# Patient Record
Sex: Female | Born: 1990 | Race: Black or African American | Hispanic: No | Marital: Single | Smoking: Current every day smoker
Health system: Southern US, Community
[De-identification: ages and names within clinical notes are randomized; demographics above are authoritative.]

## PROBLEM LIST (undated history)

## (undated) ENCOUNTER — Inpatient Hospital Stay (HOSPITAL_COMMUNITY): Payer: Self-pay

## (undated) DIAGNOSIS — F329 Major depressive disorder, single episode, unspecified: Secondary | ICD-10-CM

## (undated) DIAGNOSIS — I499 Cardiac arrhythmia, unspecified: Secondary | ICD-10-CM

## (undated) DIAGNOSIS — F32A Depression, unspecified: Secondary | ICD-10-CM

## (undated) DIAGNOSIS — F419 Anxiety disorder, unspecified: Secondary | ICD-10-CM

---
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
## 2009-12-20 ENCOUNTER — Inpatient Hospital Stay (HOSPITAL_COMMUNITY): Admission: AD | Admit: 2009-12-20 | Discharge: 2009-12-21 | Payer: Self-pay | Admitting: Obstetrics and Gynecology

## 2009-12-20 ENCOUNTER — Ambulatory Visit: Payer: Self-pay | Admitting: Obstetrics and Gynecology

## 2009-12-22 ENCOUNTER — Ambulatory Visit: Payer: Self-pay | Admitting: Obstetrics & Gynecology

## 2009-12-22 ENCOUNTER — Inpatient Hospital Stay (HOSPITAL_COMMUNITY): Admission: AD | Admit: 2009-12-22 | Discharge: 2009-12-25 | Payer: Self-pay | Admitting: Obstetrics and Gynecology

## 2010-02-08 ENCOUNTER — Encounter: Payer: Self-pay | Admitting: *Deleted

## 2010-05-26 NOTE — Miscellaneous (Signed)
Summary: Do Not Reschedule  Pt missed NP appt.  Per [facility] policy is not allowed to reschedule.  Dennison Nancy RN  February 08, 2010 2:31 PM

## 2010-07-09 LAB — CBC
Hemoglobin: 9.9 g/dL — ABNORMAL LOW (ref 12.0–15.0)
MCH: 31.2 pg (ref 26.0–34.0)
MCHC: 34.7 g/dL (ref 30.0–36.0)
Platelets: 192 10*3/uL (ref 150–400)
RBC: 3.65 MIL/uL — ABNORMAL LOW (ref 3.87–5.11)
RDW: 14.1 % (ref 11.5–15.5)
WBC: 7.8 10*3/uL (ref 4.0–10.5)
WBC: 8.5 10*3/uL (ref 4.0–10.5)

## 2010-12-30 ENCOUNTER — Emergency Department (HOSPITAL_COMMUNITY)
Admission: EM | Admit: 2010-12-30 | Discharge: 2010-12-31 | Disposition: A | Discharge status: Other Acute Inpatient Hospital | Payer: Medicaid Other | Source: Home / Self Care | Attending: Emergency Medicine | Admitting: Emergency Medicine

## 2010-12-30 DIAGNOSIS — F329 Major depressive disorder, single episode, unspecified: Secondary | ICD-10-CM | POA: Insufficient documentation

## 2010-12-30 DIAGNOSIS — F1410 Cocaine abuse, uncomplicated: Secondary | ICD-10-CM | POA: Insufficient documentation

## 2010-12-30 DIAGNOSIS — F3289 Other specified depressive episodes: Secondary | ICD-10-CM | POA: Insufficient documentation

## 2010-12-30 LAB — COMPREHENSIVE METABOLIC PANEL
AST: 19 U/L (ref 0–37)
Albumin: 3.8 g/dL (ref 3.5–5.2)
CO2: 28 mEq/L (ref 19–32)
Creatinine, Ser: 0.84 mg/dL (ref 0.50–1.10)
GFR calc Af Amer: >60 mL/min (ref >60)
GFR calc non Af Amer: >60 mL/min (ref >60)
Sodium: 139 mEq/L (ref 135–145)
Total Protein: 7.4 g/dL (ref 6.0–8.3)

## 2010-12-30 LAB — ETHANOL: Alcohol, Ethyl (B): <11 mg/dL (ref 0–11)

## 2010-12-30 LAB — DIFFERENTIAL
Eosinophils Absolute: 0.3 10*3/uL (ref 0.0–0.7)
Monocytes Relative: 8 % (ref 3–12)
Neutro Abs: 6 10*3/uL (ref 1.7–7.7)
Neutrophils Relative %: 54 % (ref 43–77)

## 2010-12-30 LAB — CBC
Hemoglobin: 13.2 g/dL (ref 12.0–15.0)
MCHC: 33.9 g/dL (ref 30.0–36.0)
RBC: 4.53 MIL/uL (ref 3.87–5.11)
RDW: 14.5 % (ref 11.5–15.5)
WBC: 11 10*3/uL — ABNORMAL HIGH (ref 4.0–10.5)

## 2010-12-31 ENCOUNTER — Inpatient Hospital Stay (HOSPITAL_COMMUNITY)
Admission: RE | Admit: 2010-12-31 | Discharge: 2011-01-06 | DRG: 897 | Disposition: A | Discharge status: Home / Self Care | Payer: Medicaid Other | Source: Intra-hospital | Attending: Psychiatry | Admitting: Psychiatry

## 2010-12-31 DIAGNOSIS — F1920 Other psychoactive substance dependence, uncomplicated: Principal | ICD-10-CM

## 2010-12-31 DIAGNOSIS — F4001 Agoraphobia with panic disorder: Secondary | ICD-10-CM

## 2010-12-31 DIAGNOSIS — F1420 Cocaine dependence, uncomplicated: Secondary | ICD-10-CM

## 2010-12-31 DIAGNOSIS — F17200 Nicotine dependence, unspecified, uncomplicated: Secondary | ICD-10-CM

## 2010-12-31 DIAGNOSIS — F1020 Alcohol dependence, uncomplicated: Secondary | ICD-10-CM

## 2010-12-31 DIAGNOSIS — E669 Obesity, unspecified: Secondary | ICD-10-CM

## 2010-12-31 DIAGNOSIS — Z79899 Other long term (current) drug therapy: Secondary | ICD-10-CM

## 2010-12-31 DIAGNOSIS — G43909 Migraine, unspecified, not intractable, without status migrainosus: Secondary | ICD-10-CM

## 2010-12-31 LAB — URINALYSIS, ROUTINE W REFLEX MICROSCOPIC
Bilirubin Urine: NEGATIVE
Leukocytes, UA: NEGATIVE
Nitrite: NEGATIVE
Specific Gravity, Urine: 1.038 — ABNORMAL HIGH (ref 1.005–1.030)
Urobilinogen, UA: 1 mg/dL (ref 0.0–1.0)
pH: 6 (ref 5.0–8.0)

## 2010-12-31 LAB — RAPID URINE DRUG SCREEN, HOSP PERFORMED
Amphetamines: NOT DETECTED
Barbiturates: NOT DETECTED
Benzodiazepines: NOT DETECTED

## 2010-12-31 LAB — URINE MICROSCOPIC-ADD ON

## 2011-01-06 NOTE — Discharge Summary (Signed)
  NAMECAYLEI, [name]             ACCOUNT NO.:  [id number]  MEDICAL RECORD NO.:  [id number]  LOCATION:  0301                          FACILITY:  BH  PHYSICIAN:  Orson Aloe, MD       DATE OF BIRTH:  1991-01-05  DATE OF ADMISSION:  12/31/2010 DATE OF DISCHARGE:                              DISCHARGE SUMMARY   CONTINUATION:  HOSPITAL COURSE:  The patient was tried on Thorazine for anxiety and increased to 50 mg 3 times a day without benefit.  She decided to stop that.  Vistaril 25 mg 3 times a day apparently was what helped her the best, and she was discharged on that.  She also had panic which greatly benefited from being on Zoloft 200 mg in the morning, and she elected to try that, and that was successful, and she was discharged on that. Imitrex 50 mg was ordered for her.  She did not use it much.  CONDITION ON DISCHARGE:  The patient denies any suicidal, homicidal ideation.  Denies any hallucinations, delusions, allusions.  She had good eye contact, was able to focus in one-to-one and group settings, had clear goal-directed thoughts.  She had natural conversational speech, volume rate and tone.  She was oriented x4.  Recent and remote memory was intact.  Judgment was considered able to agree to a safe follow-up.  Insight was improved from admission.  DIAGNOSES:  AXIS I:  Polysubstance dependence of cocaine, alcohol, nicotine and panic disorder with agoraphobia AXIS II: Deferred AXIS III: History of migraines and obesity AXIS IV: Moderate AXIS V: 50.  DISCHARGE RECOMMENDATIONS: 1. Activity.  Resume typical activity. 2. Diet.  Resume typical diet, consider a low-calorie diet.  DISCHARGE MEDICATIONS: 1. Hydroxyzine 25 mg 3 times a day 2. Seroquel 50 mg twice a day for mood 3. Zoloft 100 mg in the morning, also for panic attacks 4. Imitrex 1 tablet as directed 5. Nicotine 21 mg patch  She was given a prescription for the Imitrex.  She was given samples of all the  others to take with her to [facility] at [facility] on 09/13 at 8 a.m.          ______________________________ Orson Aloe, MD     EW/MEDQ  D:  01/05/2011  T:  01/06/2011  Job:  [id number]  Electronically Signed by Orson Aloe  on 01/06/2011 09:48:40 AM
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
## 2011-09-12 ENCOUNTER — Emergency Department (HOSPITAL_COMMUNITY)
Admission: EM | Admit: 2011-09-12 | Discharge: 2011-09-12 | Disposition: A | Discharge status: Home / Self Care | Payer: Self-pay | Attending: Emergency Medicine | Admitting: Emergency Medicine

## 2011-09-12 ENCOUNTER — Encounter (HOSPITAL_COMMUNITY): Payer: Self-pay | Admitting: *Deleted

## 2011-09-12 DIAGNOSIS — F410 Panic disorder [episodic paroxysmal anxiety] without agoraphobia: Secondary | ICD-10-CM | POA: Insufficient documentation

## 2011-09-12 DIAGNOSIS — R300 Dysuria: Secondary | ICD-10-CM | POA: Insufficient documentation

## 2011-09-12 DIAGNOSIS — R079 Chest pain, unspecified: Secondary | ICD-10-CM | POA: Insufficient documentation

## 2011-09-12 HISTORY — DX: Anxiety disorder, unspecified: F41.9

## 2011-09-12 LAB — URINALYSIS, ROUTINE W REFLEX MICROSCOPIC
Ketones, ur: 15 mg/dL — AB
Protein, ur: NEGATIVE mg/dL
Specific Gravity, Urine: 1.021 (ref 1.005–1.030)
Urobilinogen, UA: 1 mg/dL (ref 0.0–1.0)

## 2011-09-12 LAB — URINE MICROSCOPIC-ADD ON

## 2011-09-12 NOTE — ED Provider Notes (Signed)
History     CSN: [id number]  Arrival date & time 09/12/11  1448   First MD Initiated Contact with Patient 09/12/11 1745      Chief Complaint  Patient presents with  . Panic Attack  . Chest Pain  . Dysuria    (Consider location/radiation/quality/duration/timing/severity/associated sxs/prior treatment) HPI Pt reports she has history of depression and anxiety, has had panic attack twice since yesterday, precipitated by stress, worse when walking around. Symptoms described as chest squeezing and SOB which is typical for her. She has been feeling better since arrival in the ED and now is asymptomatic. She has a second complaint of dysuria, intermittent since yesterday, not associated with bleeding, fever or back pain.   Past Medical History  Diagnosis Date  . Anxiety     History reviewed. No pertinent past surgical history.  History reviewed. No pertinent family history.  History  Substance Use Topics  . Smoking status: Current Everyday Smoker  . Smokeless tobacco: Not on file  . Alcohol Use: No    OB History    Grav Para Term Preterm Abortions TAB SAB Ect Mult Living                  Review of Systems All other systems reviewed and are negative except as noted in HPI.   Allergies  Review of patient's allergies indicates no known allergies.  Home Medications   Current Outpatient Rx  Name Route Sig Dispense Refill  . SERTRALINE HCL 100 MG PO TABS Oral Take 100 mg by mouth daily.    . TRAZODONE HCL 100 MG PO TABS Oral Take 100 mg by mouth at bedtime.      BP 127/81  Temp(Src) 98.7 F (37.1 C) (Oral)  Resp 24  SpO2 97%  LMP 08/19/2011  Physical Exam  Nursing note and vitals reviewed. Constitutional: She is oriented to person, place, and time. She appears well-developed and well-nourished.  HENT:  Head: Normocephalic and atraumatic.  Eyes: EOM are normal. Pupils are equal, round, and reactive to light.  Neck: Normal range of motion. Neck supple.    Cardiovascular: Normal rate, normal heart sounds and intact distal pulses.   Pulmonary/Chest: Effort normal and breath sounds normal.  Abdominal: Bowel sounds are normal. She exhibits no distension. There is no tenderness.  Musculoskeletal: Normal range of motion. She exhibits no edema and no tenderness.  Neurological: She is alert and oriented to person, place, and time. She has normal strength. No cranial nerve deficit or sensory deficit.  Skin: Skin is warm and dry. No rash noted.  Psychiatric: She has a normal mood and affect.    ED Course  Procedures (including critical care time)   Labs Reviewed  URINALYSIS, ROUTINE W REFLEX MICROSCOPIC  POCT PREGNANCY, URINE   No results found.   No diagnosis found.    MDM   Date: 09/12/2011  Rate: 91  Rhythm: normal sinus rhythm  QRS Axis: normal  Intervals: normal  ST/T Wave abnormalities: normal  Conduction Disutrbances:none  Narrative Interpretation:   Old EKG Reviewed: unchanged    7:25 PM Pt remains asymptomatic. She and significant other requesting a note to allow her to rest in the shelter during the day.       Jacquilyn Seldon B. Bernette Mayers, MD 09/12/11 [id number]

## 2011-09-12 NOTE — Discharge Instructions (Signed)

## 2011-09-12 NOTE — ED Notes (Addendum)
Patient states yesterday and she started having chest pain, patient states pain and anxiety subsided this am and now anxiety returning and chest "squeezing feeling" returning and sob with exertion, patient also with c/o burning with urination

## 2011-09-23 ENCOUNTER — Encounter (HOSPITAL_COMMUNITY): Payer: Self-pay | Admitting: Emergency Medicine

## 2011-09-23 ENCOUNTER — Emergency Department (HOSPITAL_COMMUNITY)
Admission: EM | Admit: 2011-09-23 | Discharge: 2011-09-23 | Disposition: A | Discharge status: Home / Self Care | Payer: Self-pay | Attending: Emergency Medicine | Admitting: Emergency Medicine

## 2011-09-23 DIAGNOSIS — N61 Mastitis without abscess: Secondary | ICD-10-CM | POA: Insufficient documentation

## 2011-09-23 DIAGNOSIS — N611 Abscess of the breast and nipple: Secondary | ICD-10-CM

## 2011-09-23 DIAGNOSIS — Z87891 Personal history of nicotine dependence: Secondary | ICD-10-CM | POA: Insufficient documentation

## 2011-09-23 HISTORY — DX: Major depressive disorder, single episode, unspecified: F32.9

## 2011-09-23 HISTORY — DX: Depression, unspecified: F32.A

## 2011-09-23 MED ORDER — SULFAMETHOXAZOLE-TRIMETHOPRIM 800-160 MG PO TABS: 2.0000 | ORAL_TABLET | Freq: Two times a day (BID) | ORAL | Status: AC

## 2011-09-23 MED ORDER — IBUPROFEN 800 MG PO TABS: 800.0000 mg | ORAL_TABLET | Freq: Three times a day (TID) | ORAL | Status: AC | PRN

## 2011-09-23 NOTE — ED Provider Notes (Signed)
History     CSN: [id number]  Arrival date & time 09/23/11  1818   First MD Initiated Contact with Patient 09/23/11 1907      Chief Complaint  Patient presents with  . Abscess    (Consider location/radiation/quality/duration/timing/severity/associated sxs/prior treatment) HPI Comments: Patient reports she has had a "bump" under her right breast for several weeks. States over the past few days it has become more painful, opened on its own and started draining a brown malodorous fluid.  Denies fevers.  Pt has hx of abscess of axillae and buttocks.  No known hx hidradenitis suppurativa.    Patient is a 21 y.o. female presenting with abscess. The history is provided by the patient and a significant other.  Abscess  Pertinent negatives include no fever and no vomiting.    Past Medical History  Diagnosis Date  . Anxiety   . Depression     Past Surgical History  Procedure Date  . Cesarean section     No family history on file.  History  Substance Use Topics  . Smoking status: Former Games developer  . Smokeless tobacco: Not on file  . Alcohol Use: No    OB History    Grav Para Term Preterm Abortions TAB SAB Ect Mult Living                  Review of Systems  Constitutional: Negative for fever and chills.  Gastrointestinal: Negative for nausea and vomiting.  Musculoskeletal: Negative for myalgias.  Skin: Positive for wound. Negative for rash.    Allergies  Review of patient's allergies indicates no known allergies.  Home Medications   Current Outpatient Rx  Name Route Sig Dispense Refill  . HYDROXYZINE PAMOATE 100 MG PO CAPS Oral Take 100 mg by mouth daily.    . SERTRALINE HCL 100 MG PO TABS Oral Take 100 mg by mouth daily.    . TRAZODONE HCL 100 MG PO TABS Oral Take 100 mg by mouth at bedtime.      BP 120/75  Pulse 101  Temp(Src) 98.4 F (36.9 C) (Oral)  Resp 16  SpO2 100%  LMP 09/19/2011  Physical Exam  Nursing note and vitals reviewed. Constitutional: She  appears well-developed and well-nourished.  HENT:  Head: Normocephalic and atraumatic.  Neck: Neck supple.  Pulmonary/Chest: Effort normal.         At crease under right breast there is an opening in the skin, round, approximately 1cm, healthy tissue underneath.  Slight malodorous discharge.  No area of induration, no obvious collection underneath.  No overlying erythema.  Very mild tenderness to palpation.    Neurological: She is alert.    ED Course  Procedures (including critical care time)  Labs Reviewed - No data to display No results found.   1. Abscess of right breast       MDM  Afebrile pt with draining abscess under right breast.  Wound is actively draining on its own and has opening approx 1 cm diameter.  No obvious collection underneath, no overlying erythema.  Pt d/c home with bactrim DS 2 pills PO BID x 7 days, ibuprofen for pain, surgical follow up.  Care instructions and return precautions given.  Patient verbalizes understanding and agrees with plan.          Dillard Cannon [place], [place] 09/23/11 1954

## 2011-09-23 NOTE — Discharge Instructions (Signed)
Read the information below.  Use warm moist compresses throughout the day to encourage drainage from the abscess.  Please follow up with the surgeon listed above.  If you develop fevers, redness around the area, or worsening pain, call the surgeon or return to the ER immediately for a recheck.  You may return to the ER at any time for worsening condition or any new symptoms that concern you.

## 2011-09-23 NOTE — ED Notes (Signed)
C/o abscess under R breast x 2 weeks.  Reports brown drainage.

## 2011-09-23 NOTE — ED Notes (Signed)
States she had bump on breast for a couple of weeks. Thinks it may have burst open yesterday

## 2011-09-24 NOTE — ED Provider Notes (Signed)
Medical screening examination/treatment/procedure(s) were performed by non-physician practitioner and as supervising physician I was immediately available for consultation/collaboration.   Jahnae Mcadoo, MD 09/24/11 1858 

## 2012-02-10 ENCOUNTER — Encounter (HOSPITAL_COMMUNITY): Payer: Self-pay | Admitting: *Deleted

## 2012-02-10 ENCOUNTER — Emergency Department (HOSPITAL_COMMUNITY)
Admission: EM | Admit: 2012-02-10 | Discharge: 2012-02-10 | Disposition: A | Discharge status: Home / Self Care | Payer: Self-pay | Attending: Emergency Medicine | Admitting: Emergency Medicine

## 2012-02-10 DIAGNOSIS — Z87891 Personal history of nicotine dependence: Secondary | ICD-10-CM | POA: Insufficient documentation

## 2012-02-10 DIAGNOSIS — F329 Major depressive disorder, single episode, unspecified: Secondary | ICD-10-CM | POA: Insufficient documentation

## 2012-02-10 DIAGNOSIS — H6690 Otitis media, unspecified, unspecified ear: Secondary | ICD-10-CM | POA: Insufficient documentation

## 2012-02-10 DIAGNOSIS — F3289 Other specified depressive episodes: Secondary | ICD-10-CM | POA: Insufficient documentation

## 2012-02-10 DIAGNOSIS — F411 Generalized anxiety disorder: Secondary | ICD-10-CM | POA: Insufficient documentation

## 2012-02-10 DIAGNOSIS — H6691 Otitis media, unspecified, right ear: Secondary | ICD-10-CM

## 2012-02-10 MED ORDER — AMOXICILLIN 500 MG PO CAPS: 500.0000 mg | ORAL_CAPSULE | Freq: Three times a day (TID) | ORAL | Status: DC

## 2012-02-10 MED ORDER — ANTIPYRINE-BENZOCAINE 5.4-1.4 % OT SOLN
3.0000 [drp] | OTIC | Status: DC | PRN
  Administered 2012-02-10: 3 [drp] via OTIC
  Filled 2012-02-10: qty 10

## 2012-02-10 NOTE — ED Provider Notes (Signed)
History  This chart was scribed for Ward Givens, MD by Ladona Ridgel Day. This patient was seen in room TR10C/TR10C and the patient's care was started at 0951.   CSN: [id number]  Arrival date & time 02/10/12  1308   First MD Initiated Contact with Patient 02/10/12 1059      No chief complaint on file.  The history is provided by the patient. No language interpreter was used.   Kimberly [name] is a 21 y.o. female who presents to the Emergency Department complaining of constant gradually worsening right ear pain  which woke her out of sleep at 230 AM. She states no drainage and no previous similar episodes to today. She thinks maybe it was caused by a shower before she went to bed but she denies any other recent illnesses. She denies cough, rhinorrhea, fever, congestion, SOB.  PCP none [facility]  Past Medical History  Diagnosis Date  . Anxiety   . Depression     Past Surgical History  Procedure Date  . Cesarean section     No family history on file.  History  Substance Use Topics  . Smoking status: Former Games developer  . Smokeless tobacco: Not on file  . Alcohol Use: No  student   OB History    Grav Para Term Preterm Abortions TAB SAB Ect Mult Living                  Review of Systems  Constitutional: Negative for fever and chills.  HENT: Positive for ear pain (bilateral). Negative for congestion and rhinorrhea.   Respiratory: Negative for shortness of breath.   Gastrointestinal: Negative for nausea and vomiting.  Neurological: Negative for weakness.    Allergies  Review of patient's allergies indicates no known allergies.  Home Medications   Current Outpatient Rx  Name Route Sig Dispense Refill  . MIDOL COMPLETE PO Oral Take 2 tablets by mouth every 6 (six) hours as needed. For cramps    . HYDROXYZINE PAMOATE 100 MG PO CAPS Oral Take 100 mg by mouth daily.    . SERTRALINE HCL 100 MG PO TABS Oral Take 100 mg by mouth daily.    . TRAZODONE HCL 100 MG PO  TABS Oral Take 100 mg by mouth at bedtime.      Triage Vitals: BP 118/75  Pulse 67  Temp 99.4 F (37.4 C) (Oral)  Resp 20  SpO2 98%  LMP 01/20/2012  Vital signs normal    Physical Exam  Nursing note and vitals reviewed. Constitutional: She is oriented to person, place, and time. She appears well-developed and well-nourished.  Non-toxic appearance. She does not appear ill. No distress.  HENT:  Head: Normocephalic and atraumatic.  Left Ear: External ear normal.  Nose: Nose normal. No mucosal edema or rhinorrhea.  Mouth/Throat: Oropharynx is clear and moist and mucous membranes are normal. No dental abscesses or uvula swelling.       Right TM dull, slightly bulging and injected, left TM is clear No pain with tragal pulling, no swelling in the ear canal or around the tragus/pinnae  Eyes: Conjunctivae normal and EOM are normal. Pupils are equal, round, and reactive to light.  Neck: Normal range of motion and full passive range of motion without pain. Neck supple.  Pulmonary/Chest: Effort normal. No respiratory distress. She has no rhonchi. She exhibits no crepitus.  Abdominal: Soft. Normal appearance and bowel sounds are normal. She exhibits no distension. There is no tenderness. There is no rebound and  no guarding.  Musculoskeletal: Normal range of motion. She exhibits no edema and no tenderness.       Moves all extremities well.   Neurological: She is alert and oriented to person, place, and time. She has normal strength. No cranial nerve deficit.  Skin: Skin is warm, dry and intact. No rash noted. No erythema. No pallor.  Psychiatric: She has a normal mood and affect. Her speech is normal and behavior is normal. Her mood appears not anxious.    ED Course  Procedures (including critical care time)  Medications  Acetaminophen-Caff-Pyrilamine (MIDOL COMPLETE PO) (not administered)  antipyrine-benzocaine (AURALGAN) otic solution 3-4 drop (3 drop Right Ear Given 02/10/12 1138)      DIAGNOSTIC STUDIES: Oxygen Saturation is 98% on room air, normal by my interpretation.    COORDINATION OF CARE: At 1115 AM Discussed treatment plan with patient which includes ear drops. Patient agrees.    1. Otitis media of right ear     New Prescriptions   AMOXICILLIN (AMOXIL) 500 MG CAPSULE    Take 1 capsule (500 mg total) by mouth 3 (three) times daily.    Plan discharge  Devoria Albe, MD, FACEP   MDM   I personally performed the services described in this documentation, which was scribed in my presence. The recorded information has been reviewed and considered.  Devoria Albe, MD, Armando Gang          Ward Givens, MD 02/10/12 [phone]

## 2012-02-10 NOTE — ED Notes (Signed)
Took shower last night; woke up ~ 0230 rt. Ear ache.

## 2014-02-13 ENCOUNTER — Emergency Department (HOSPITAL_COMMUNITY)
Admission: EM | Admit: 2014-02-13 | Discharge: 2014-02-13 | Disposition: A | Discharge status: Home / Self Care | Payer: Self-pay | Attending: Emergency Medicine | Admitting: Emergency Medicine

## 2014-02-13 ENCOUNTER — Encounter (HOSPITAL_COMMUNITY): Payer: Self-pay | Admitting: Emergency Medicine

## 2014-02-13 ENCOUNTER — Emergency Department (HOSPITAL_COMMUNITY): Payer: Self-pay

## 2014-02-13 DIAGNOSIS — Y929 Unspecified place or not applicable: Secondary | ICD-10-CM | POA: Insufficient documentation

## 2014-02-13 DIAGNOSIS — X58XXXA Exposure to other specified factors, initial encounter: Secondary | ICD-10-CM | POA: Insufficient documentation

## 2014-02-13 DIAGNOSIS — Z79899 Other long term (current) drug therapy: Secondary | ICD-10-CM | POA: Insufficient documentation

## 2014-02-13 DIAGNOSIS — Y939 Activity, unspecified: Secondary | ICD-10-CM | POA: Insufficient documentation

## 2014-02-13 DIAGNOSIS — F419 Anxiety disorder, unspecified: Secondary | ICD-10-CM | POA: Insufficient documentation

## 2014-02-13 DIAGNOSIS — F329 Major depressive disorder, single episode, unspecified: Secondary | ICD-10-CM | POA: Insufficient documentation

## 2014-02-13 DIAGNOSIS — S93402A Sprain of unspecified ligament of left ankle, initial encounter: Secondary | ICD-10-CM | POA: Insufficient documentation

## 2014-02-13 DIAGNOSIS — Z87891 Personal history of nicotine dependence: Secondary | ICD-10-CM | POA: Insufficient documentation

## 2014-02-13 HISTORY — DX: Cardiac arrhythmia, unspecified: I49.9

## 2014-02-13 MED ORDER — IBUPROFEN 400 MG PO TABS
600.0000 mg | ORAL_TABLET | Freq: Once | ORAL | Status: AC
  Administered 2014-02-13: 600 mg via ORAL
  Filled 2014-02-13 (×2): qty 1

## 2014-02-13 NOTE — ED Notes (Signed)
Pt states started working at [employer] last week and old foot injury flared up.  C/o pain to L foot.
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
## 2014-02-13 NOTE — Discharge Instructions (Signed)
1. Motrin 600mg  three times a day for 2-3 days then as needed every 6 hours with food 2. Call to get a PCP   Emergency Department Resource Guide 1) Find a Doctor and Pay Out of Pocket Although you won't have to find out who is covered by your insurance plan, it is a good idea to ask around and get recommendations. You will then need to call the office and see if the doctor you have chosen will accept you as a new patient and what types of options they offer for patients who are self-pay. Some doctors offer discounts or will set up payment plans for their patients who do not have insurance, but you will need to ask so you aren't surprised when you get to your appointment.  2) Contact Your Local Health Department Not all health departments have doctors that can see patients for sick visits, but many do, so it is worth a call to see if yours does. If you don't know where your local health department is, you can check in your phone book. The CDC also has a tool to help you locate your state's health department, and many state websites also have listings of all of their local health departments.  3) Find a Walk-in Clinic If your illness is not likely to be very severe or complicated, you may want to try a walk in clinic. These are popping up all over the country in pharmacies, drugstores, and shopping centers. They're usually staffed by nurse practitioners or physician assistants that have been trained to treat common illnesses and complaints. They're usually fairly quick and inexpensive. However, if you have serious medical issues or chronic medical problems, these are probably not your best option.  No Primary Care Doctor: - Call Health Connect at  [phone] - they can help you locate a primary care doctor that  accepts your insurance, provides certain services, etc. - Physician Referral Service- [phone]  Chronic Pain Problems: Organization         Address  Phone   Notes  [facility]  [phone] Patients need to be referred by their primary care doctor.   Medication Assistance: Organization         Address  Phone   Notes  [facility] Medication [facility] [address] [place] [place], [place] [address] [phone] --Must be a resident of [facility] -- Must have NO insurance coverage whatsoever (no Medicaid/ Medicare, etc.) -- The pt. MUST have a primary care doctor that directs their care regularly and follows them in the community   [facility]ist  [phone]   [employer]  [phone]    Agencies that provide inexpensive medical care: Organization         Address  Phone   Notes  [facility]  [phone]   [facility]    [phone]   [facility] [address] [place], [place] [address] [phone]   [facility] [address] [place] [place] [phone]   Planned Parenthood    [phone]   [facility]    [phone]   [facility]  [address], [place] Phone:  [phone], Fax:  [phone] Hours of Operation:  9 am - 6 pm, M-F.  Also accepts Medicaid/Medicare and self-pay.  [facility]  [address], [place] Phone: [phone], Fax: [phone]. Hours of Operation:  8:30 am - 5:30 pm, M-F.  Also accepts Medicaid and self-pay.  [facility] [place] [address] [place] Phone: [phone]   [facility] [address] Natasha Bence [place], [place] [phone], Ext. [phone] Mondays & Thursdays: 7-9 AM.  First 15 patients are seen on a first come, first serve basis.    Medicaid-accepting [facility] Providers:  Organization         Address  Phone   Notes  [facility] [address], [place] [phone] Also accepts self-pay patients.  [facility] [address] Laurell Josephs  [place], [place]  [phone]   [facility] [address], [place] [phone]   [facility] [address], [place] [phone]   Renaye Rakers [address], [place]   [phone] Only accepts [place] patients after they have their name applied to their card.   Self-Pay (no insurance) in [facility]):  Organization         Address  Phone   Notes  Sickle Cell Patients, [facility] [address] [place], [place] [phone]   [facility] [address] [place], [place] [phone]   [facility] [place]  [address], [place] [phone]   [facility]/Dr. Osei-Bonsu  [address], [place] or [address], [place] [phone] Phone number for both [place] and [place] locations is the same.  [facility] [address] [place] [phone]   [facility] [address] [place] or [address] [phone]   [facility] [address] [place] [phone], phone; [phone], fax Sees patients 1st and 3rd Saturday of every month.  Must not qualify for public or private insurance (i.e. Medicaid, Medicare, [place] Health Choice, Veterans' Benefits)  Household income should be no more than 200% of the poverty level The clinic cannot treat you if you are pregnant or think you are pregnant  Sexually transmitted diseases are not treated at the clinic.    Dental Care: Organization         Address  Phone  Notes  [facility] Department of [facility] [address] [place], [place] [phone] Accepts children up to age 80 who are enrolled in [place] or [place] Health Choice; pregnant women with a Medicaid card; and children who have applied for Medicaid or [place]  Health Choice, but were declined, whose parents can pay a reduced fee at time of service.  [facility] Department of [facility]  [address], [place] [phone] Accepts children up to age 36 who are enrolled in [place] or [place] Health Choice; pregnant women with a Medicaid card; and children who have applied for Medicaid or [place] Health Choice, but were declined, whose parents can pay a reduced fee at time of service.  [facility]  [address] [place], [place] [phone] Patients are seen by appointment only. Walk-ins are not accepted. [facility] will see patients 54 years of age and older. Monday - Tuesday (8am-5pm) Most Wednesdays (8:30-5pm) $30 per visit, cash only  [facility]  [address],  [place] [phone] Patients are seen by appointment only. Walk-ins are not accepted. [facility] will see patients 43 years of age and older. One Wednesday Evening (Monthly: Volunteer Based).  $30 per visit, cash only  [employer]  [phone] for adults; Children under age 63, call Graduate Pediatric Dentistry at [phone]. Children aged 7-14, please call [phone] to request a pediatric application.  Dental services are provided in all areas of dental care including fillings, crowns and bridges, complete and partial dentures, implants, gum treatment, root canals, and extractions. Preventive care is also provided. Treatment is provided to both adults and children. Patients are selected via a lottery and there is often a waiting list.   [facility] [address] [place]  [phone] [email or web address]   [facility] [address] [place], [place] [phone], Ext. [phone] Second and Fourth Thursday of each month, opens at 6:30 AM; Clinic ends at 9 AM.  Patients are seen on a first-come first-served basis, and a limited number are seen during  each clinic.   [facility]  [address] Ether Griffins [place], [place] [phone]   Eligibility Requirements You must have lived in [place], [place], or [place] counties for at least the last three months.   You cannot be eligible for state or federal sponsored [place], including [employer], [place], or [employer].   You generally cannot be eligible for healthcare insurance through your employer.    How to apply: Eligibility screenings are held every Tuesday and Wednesday afternoon from 1:00 pm until 4:00 pm. You do not need an appointment for the interview!  [facility] [address], [place], [place] [phone]   [facility] Health Department  [phone]   [facility] Health Department  [phone]   [facility] Health Department  [phone]    Behavioral Health Resources in the Community: Intensive Outpatient Programs Organization         Address  Phone  Notes  [facility] [address], [place], [place] [phone]   [facility] Outpatient [address], [place], [place] [phone]   [facility] [address] [place], [place]  [phone]   [facility] [address],  [place], [place] [phone] or [phone]   Substance Abuse Resources Organization         Address  Phone  Notes  [facility]  [phone]   [facility]  [phone]   [facility] [place]  [phone]   Floydene Flock  [phone]   Residential & Outpatient Substance Abuse Program  [phone]   Psychological Services Organization         Address  Phone  Notes  [facility] Behavioral Health  [phone]   [facility] Services  [phone]   [facility] Health [address] [place] [phone] or [phone]    Mobile Crisis Teams Organization         Address  Phone  Notes  [facility], Mobile  Crisis Care Unit  [phone]   [facility]  [address] [place], [place] [phone]   Doristine Locks [address] [place] [phone]    Self-Help/Support Groups Organization         Address  Phone             Notes  [facility] [place] - variety of support  groups  [phone]- [phone] Call for more information  Narcotics Anonymous (NA), [facility] [address], [place]  2 meetings at this location   Residential Sports administratorTreatment Programs Organization         Address  Phone  Notes  [facility] [address],    [place]  [phone]   [facility]  [address], [place] [phone], [place], [place] [phone]   [facility] [address], [place] [phone] Admissions: 8am-3pm M-F  [facility] [address]    [place], [place] [phone]   [facility] [address], [place], [place] [phone]   [facility] [address]  [place], [place] [phone]   [facility] - Intensive Outpatient [address], [place], [place] [phone]   [facility] ([facility] [address]  [place], [place] [phone] or [phone]   [facility] ([facility]) [address] [place], [place] [phone] Accepts Medicaid  [facility] [address]  [place] [phone] Substance Abuse/Addiction Treatment   [facility] Behavioral Health Resources Organization         Address  Phone  Notes  [facility]  [phone](888) [phone]   Angie FavaJulie Brannon, PhD [address] [place], [place]   [phone](336) [phone] or [phone]   [facility]   [address] [place], [place] [phone]) [phone]   [facility] [address], [place], [place] [phone](336) [phone] Insurance/Medicaid/sponsorship through [facility]  [facility] [address]                                    [place], [place] [phone](336) [phone] Therapy/tele-psych/case  [facility] [address]   [place], [place] [phone]) [phone]    Dr. Lolly MustacheArfeen  [phone](336) [phone]   [facility]  [employer] [facility] 1) [address], [place] 2) [address], [place] 3)  [address], [place] [phone](336) [phone]  [phone](336) [phone]   [facility] Child Abuse Hotline [phone]) [phone] or [phone](336) [phone] (After Hours)       Ankle Sprain An ankle sprain is an injury to the strong, fibrous tissues (ligaments) that hold the bones of your ankle joint together.  CAUSES An ankle sprain is usually caused by a fall or by twisting your ankle. Ankle sprains most commonly occur when you step on the outer edge of your foot, and your ankle turns inward. People who participate in sports are more prone to these types of injuries.  SYMPTOMS   Pain in your ankle. The pain may be present at rest or only when you are trying to stand or walk.  Swelling.  Bruising. Bruising may develop immediately or within 1 to 2 days after your injury.  Difficulty standing or walking, particularly when turning corners or changing directions. DIAGNOSIS  Your caregiver will ask you details about your injury and perform a physical exam of your ankle to determine if you have an ankle sprain. During the physical exam, your caregiver will press on and apply pressure to specific areas of your foot and ankle. Your caregiver will try to move your ankle in certain ways. An X-ray exam may be done to be sure a bone was not broken or a ligament did not separate  from one of the bones in your ankle (avulsion fracture).  TREATMENT  Certain types of braces can help stabilize your ankle. Your caregiver can make a recommendation for this. Your caregiver may recommend the use of medicine for pain. If your sprain is severe, your caregiver may refer you to a surgeon who helps to  restore function to parts of your skeletal system (orthopedist) or a physical therapist. HOME CARE INSTRUCTIONS   Apply ice to your injury for 1-2 days or as directed by your caregiver. Applying ice helps to reduce inflammation and pain.  Put ice in a plastic bag.  Place a towel between your skin and the bag.  Leave the ice on for 15-20 minutes at a time, every 2 hours while you are awake.  Only take over-the-counter or prescription medicines for pain, discomfort, or fever as directed by your caregiver.  Elevate your injured ankle above the level of your heart as much as possible for 2-3 days.  If your caregiver recommends crutches, use them as instructed. Gradually put weight on the affected ankle. Continue to use crutches or a cane until you can walk without feeling pain in your ankle.  If you have a plaster splint, wear the splint as directed by your caregiver. Do not rest it on anything harder than a pillow for the first 24 hours. Do not put weight on it. Do not get it wet. You may take it off to take a shower or bath.  You may have been given an elastic bandage to wear around your ankle to provide support. If the elastic bandage is too tight (you have numbness or tingling in your foot or your foot becomes cold and blue), adjust the bandage to make it comfortable.  If you have an air splint, you may blow more air into it or let air out to make it more comfortable. You may take your splint off at night and before taking a shower or bath. Wiggle your toes in the splint several times per day to decrease swelling. SEEK MEDICAL CARE IF:   You have rapidly increasing bruising or swelling.  Your toes feel extremely cold or you lose feeling in your foot.  Your pain is not relieved with medicine. SEEK IMMEDIATE MEDICAL CARE IF:  Your toes are numb or blue.  You have severe pain that is increasing. MAKE SURE YOU:   Understand these instructions.  Will watch your condition.  Will get  help right away if you are not doing well or get worse. Document Released: 04/11/2005 Document Revised: 01/04/2012 Document Reviewed: 04/23/2011 [facility] Patient Information 2015 [employer] [place]. This information is not intended to replace advice given to you by your health care provider. Make sure you discuss any questions you have with your health care provider.

## 2014-02-14 NOTE — ED Provider Notes (Signed)
Medical screening examination/treatment/procedure(s) were conducted as a shared visit with resident-physician practitioner(s) and myself.  I personally evaluated the patient during the encounter.  Pt is a 23 y.o. female with pmhx as above presenting with several weeks of left ankle/foot pain which has been worse over the past week while bearing weight at work..  She has no signs of external trauma physical exam an x-ray is negative. I will recommend RICE, NSAIDs.  Toy CookeyMegan Ainsleigh Kakos, MD 02/14/14 [phone]

## 2014-03-08 ENCOUNTER — Emergency Department (HOSPITAL_COMMUNITY)
Admission: EM | Admit: 2014-03-08 | Discharge: 2014-03-08 | Disposition: A | Discharge status: Home / Self Care | Payer: Self-pay | Attending: Emergency Medicine | Admitting: Emergency Medicine

## 2014-03-08 ENCOUNTER — Emergency Department (HOSPITAL_COMMUNITY): Payer: Self-pay

## 2014-03-08 ENCOUNTER — Encounter (HOSPITAL_COMMUNITY): Payer: Self-pay

## 2014-03-08 DIAGNOSIS — Z8679 Personal history of other diseases of the circulatory system: Secondary | ICD-10-CM | POA: Insufficient documentation

## 2014-03-08 DIAGNOSIS — R52 Pain, unspecified: Secondary | ICD-10-CM

## 2014-03-08 DIAGNOSIS — Z791 Long term (current) use of non-steroidal anti-inflammatories (NSAID): Secondary | ICD-10-CM | POA: Insufficient documentation

## 2014-03-08 DIAGNOSIS — S8391XA Sprain of unspecified site of right knee, initial encounter: Secondary | ICD-10-CM | POA: Insufficient documentation

## 2014-03-08 DIAGNOSIS — Z8659 Personal history of other mental and behavioral disorders: Secondary | ICD-10-CM | POA: Insufficient documentation

## 2014-03-08 DIAGNOSIS — Y9289 Other specified places as the place of occurrence of the external cause: Secondary | ICD-10-CM | POA: Insufficient documentation

## 2014-03-08 DIAGNOSIS — Y998 Other external cause status: Secondary | ICD-10-CM | POA: Insufficient documentation

## 2014-03-08 DIAGNOSIS — Z720 Tobacco use: Secondary | ICD-10-CM | POA: Insufficient documentation

## 2014-03-08 DIAGNOSIS — S93401A Sprain of unspecified ligament of right ankle, initial encounter: Secondary | ICD-10-CM | POA: Insufficient documentation

## 2014-03-08 DIAGNOSIS — Y9389 Activity, other specified: Secondary | ICD-10-CM | POA: Insufficient documentation

## 2014-03-08 MED ORDER — IBUPROFEN 400 MG PO TABS
400.0000 mg | ORAL_TABLET | Freq: Once | ORAL | Status: AC
  Administered 2014-03-08: 400 mg via ORAL
  Filled 2014-03-08: qty 1

## 2014-03-08 MED ORDER — HYDROCODONE-ACETAMINOPHEN 5-325 MG PO TABS: 1.0000 | ORAL_TABLET | Freq: Four times a day (QID) | ORAL | Status: DC | PRN

## 2014-03-08 MED ORDER — OXYCODONE-ACETAMINOPHEN 5-325 MG PO TABS
1.0000 | ORAL_TABLET | Freq: Once | ORAL | Status: AC
  Administered 2014-03-08: 1 via ORAL
  Filled 2014-03-08: qty 1
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
## 2014-03-08 NOTE — Progress Notes (Signed)
Orthopedic Tech Progress Note Patient Details:  Kimberly [name] 1991/01/09 [id number]  Ortho Devices Type of Ortho Device: Knee Sleeve, Crutches Ortho Device/Splint Location: RLE Ortho Device/Splint Interventions: Ordered, Application   Kimberly [name], Kimberly [name] 03/08/2014, 8:26 PM

## 2014-03-08 NOTE — ED Notes (Signed)
Ortho paged, pain returning, denies sx other than pain[facility]
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
## 2016-12-07 ENCOUNTER — Encounter (HOSPITAL_COMMUNITY): Payer: Self-pay | Admitting: *Deleted

## 2016-12-07 ENCOUNTER — Inpatient Hospital Stay (HOSPITAL_COMMUNITY)
Admission: AD | Admit: 2016-12-07 | Discharge: 2016-12-07 | Disposition: A | Discharge status: Home / Self Care | Payer: Medicaid Other | Source: Ambulatory Visit | Attending: Family Medicine | Admitting: Family Medicine

## 2016-12-07 ENCOUNTER — Inpatient Hospital Stay (HOSPITAL_COMMUNITY): Payer: Medicaid Other

## 2016-12-07 DIAGNOSIS — O99331 Smoking (tobacco) complicating pregnancy, first trimester: Secondary | ICD-10-CM | POA: Diagnosis not present

## 2016-12-07 DIAGNOSIS — O23591 Infection of other part of genital tract in pregnancy, first trimester: Secondary | ICD-10-CM | POA: Insufficient documentation

## 2016-12-07 DIAGNOSIS — R109 Unspecified abdominal pain: Secondary | ICD-10-CM | POA: Diagnosis present

## 2016-12-07 DIAGNOSIS — Z3A08 8 weeks gestation of pregnancy: Secondary | ICD-10-CM | POA: Diagnosis not present

## 2016-12-07 DIAGNOSIS — B9689 Other specified bacterial agents as the cause of diseases classified elsewhere: Secondary | ICD-10-CM

## 2016-12-07 DIAGNOSIS — N760 Acute vaginitis: Secondary | ICD-10-CM

## 2016-12-07 DIAGNOSIS — F17210 Nicotine dependence, cigarettes, uncomplicated: Secondary | ICD-10-CM | POA: Diagnosis not present

## 2016-12-07 DIAGNOSIS — A599 Trichomoniasis, unspecified: Secondary | ICD-10-CM | POA: Diagnosis not present

## 2016-12-07 DIAGNOSIS — O9989 Other specified diseases and conditions complicating pregnancy, childbirth and the puerperium: Secondary | ICD-10-CM

## 2016-12-07 DIAGNOSIS — O26899 Other specified pregnancy related conditions, unspecified trimester: Secondary | ICD-10-CM

## 2016-12-07 LAB — URINALYSIS, ROUTINE W REFLEX MICROSCOPIC
Bilirubin Urine: NEGATIVE
Glucose, UA: NEGATIVE mg/dL
HGB URINE DIPSTICK: NEGATIVE
Ketones, ur: NEGATIVE mg/dL
Leukocytes, UA: NEGATIVE
Nitrite: NEGATIVE
Protein, ur: NEGATIVE mg/dL
SPECIFIC GRAVITY, URINE: 1.012 (ref 1.005–1.030)
pH: 6 (ref 5.0–8.0)

## 2016-12-07 LAB — WET PREP, GENITAL
Sperm: NONE SEEN
Yeast Wet Prep HPF POC: NONE SEEN

## 2016-12-07 LAB — POCT PREGNANCY, URINE: Preg Test, Ur: POSITIVE — AB

## 2016-12-07 MED ORDER — VITAFOL ULTRA 29-0.6-0.4-200 MG PO CAPS: 1.0000 | ORAL_CAPSULE | Freq: Every day | ORAL | 12 refills | Status: AC

## 2016-12-07 MED ORDER — METRONIDAZOLE 500 MG PO TABS: 500.0000 mg | ORAL_TABLET | Freq: Two times a day (BID) | ORAL | 0 refills | Status: AC

## 2016-12-07 NOTE — Discharge Instructions (Signed)
Trichomoniasis °Trichomoniasis is an STI (sexually transmitted infection) that can affect both women and men. In women, the outer area of the female genitalia (vulva) and the vagina are affected. In men, the penis is mainly affected, but the prostate and other reproductive organs can also be involved. This condition can be treated with medicine. It often has no symptoms (is asymptomatic), especially in men. °What are the causes? °This condition is caused by an organism called Trichomonas vaginalis. Trichomoniasis most often spreads from person to person (is contagious) through sexual contact. °What increases the risk? °The following factors may make you more likely to develop this condition: °· Having unprotected sexual intercourse. °· Having sexual intercourse with a partner who has trichomoniasis. °· Having multiple sexual partners. °· Having had previous trichomoniasis infections or other STIs. ° °What are the signs or symptoms? °In women, symptoms of trichomoniasis include: °· Abnormal vaginal discharge that is clear, white, gray, or yellow-green and foamy and has an unusual "fishy" odor. °· Itching and irritation of the vagina and vulva. °· Burning or pain during urination or sexual intercourse. °· Genital redness and swelling. ° °In men, symptoms of trichomoniasis include: °· Penile discharge that may be foamy or contain pus. °· Pain in the penis. This may happen only when urinating. °· Itching or irritation inside the penis. °· Burning after urination or ejaculation. ° °How is this diagnosed? °In women, this condition may be found during a routine Pap test or physical exam. It may be found in men during a routine physical exam. Your health care provider may perform tests to help diagnose this infection, such as: °· Urine tests (men and women). °· The following in women: °? Testing the pH of the vagina. °? A vaginal swab test that checks for the Trichomonas vaginalis organism. °? Testing vaginal  secretions. ° °Your health care provider may test you for other STIs, including HIV (human immunodeficiency virus). °How is this treated? °This condition is treated with medicine taken by mouth (orally), such as metronidazole or tinidazole to fight the infection. Your sexual partner(s) may also need to be tested and treated. °· If you are a woman and you plan to become pregnant or think you may be pregnant, tell your health care provider right away. Some medicines that are used to treat the infection should not be taken during pregnancy. ° °Your health care provider may recommend over-the-counter medicines or creams to help relieve itching or irritation. You may be tested for infection again 3 months after treatment. °Follow these instructions at home: °· Take and use over-the-counter and prescription medicines, including creams, only as told by your health care provider. °· Do not have sexual intercourse until one week after you finish your medicine, or until your health care provider approves. Ask your health care provider when you may resume sexual intercourse. °· (Women) Do not douche or wear tampons while you have the infection. °· Discuss your infection with your sexual partner(s). Make sure that your partner gets tested and treated, if necessary. °· Keep all follow-up visits as told by your health care provider. This is important. °How is this prevented? °· Use condoms every time you have sex. Using condoms correctly and consistently can help protect against STIs. °· Avoid having multiple sexual partners. °· Talk with your sexual partner about any symptoms that either of you may have, as well as any history of STIs. °· Get tested for STIs and STDs (sexually transmitted diseases) before you have sex. Ask your partner   to do the same.  Do not have sexual contact if you have symptoms of trichomoniasis or another STI. Contact a health care provider if:  You still have symptoms after you finish your  medicine.  You develop pain in your abdomen.  You have pain when you urinate.  You have bleeding after sexual intercourse.  You develop a rash.  You feel nauseous or you vomit.  You plan to become pregnant or think you may be pregnant. Summary  Trichomoniasis is an STI (sexually transmitted infection) that can affect both women and men.  This condition often has no symptoms (is asymptomatic), especially in men.  You should not have sexual intercourse until one week after you finish your medicine, or until your health care provider approves. Ask your health care provider when you may resume sexual intercourse.  Discuss your infection with your sexual partner. Make sure that your partner gets tested and treated, if necessary. This information is not intended to replace advice given to you by your health care provider. Make sure you discuss any questions you have with your health care provider. Document Released: 10/05/2000 Document Revised: 03/04/2016 Document Reviewed: 03/04/2016 Elsevier Interactive Patient Education  2017 [employer]   Bacterial Vaginosis Bacterial vaginosis is a vaginal infection that occurs when the normal balance of bacteria in the vagina is disrupted. It results from an overgrowth of certain bacteria. This is the most common vaginal infection among women ages 5415-44. Because bacterial vaginosis increases your risk for STIs (sexually transmitted infections), getting treated can help reduce your risk for chlamydia, gonorrhea, herpes, and HIV (human immunodeficiency virus). Treatment is also important for preventing complications in pregnant women, because this condition can cause an early (premature) delivery. What are the causes? This condition is caused by an increase in harmful bacteria that are normally present in small amounts in the vagina. However, the reason that the condition develops is not fully understood. What increases the risk? The following  factors may make you more likely to develop this condition:  Having a new sexual partner or multiple sexual partners.  Having unprotected sex.  Douching.  Having an intrauterine device (IUD).  Smoking.  Drug and alcohol abuse.  Taking certain antibiotic medicines.  Being pregnant.  You cannot get bacterial vaginosis from toilet seats, bedding, swimming pools, or contact with objects around you. What are the signs or symptoms? Symptoms of this condition include:  Grey or white vaginal discharge. The discharge can also be watery or foamy.  A fish-like odor with discharge, especially after sexual intercourse or during menstruation.  Itching in and around the vagina.  Burning or pain with urination.  Some women with bacterial vaginosis have no signs or symptoms. How is this diagnosed? This condition is diagnosed based on:  Your medical history.  A physical exam of the vagina.  Testing a sample of vaginal fluid under a microscope to look for a large amount of bad bacteria or abnormal cells. Your health care provider may use a cotton swab or a small wooden spatula to collect the sample.  How is this treated? This condition is treated with antibiotics. These may be given as a pill, a vaginal cream, or a medicine that is put into the vagina (suppository). If the condition comes back after treatment, a second round of antibiotics may be needed. Follow these instructions at home: Medicines  Take over-the-counter and prescription medicines only as told by your health care provider.  Take or use your antibiotic as told by  your health care provider. Do not stop taking or using the antibiotic even if you start to feel better. General instructions  If you have a female sexual partner, tell her that you have a vaginal infection. She should see her health care provider and be treated if she has symptoms. If you have a female sexual partner, he does not need treatment.  During  treatment: ? Avoid sexual activity until you finish treatment. ? Do not douche. ? Avoid alcohol as directed by your health care provider. ? Avoid breastfeeding as directed by your health care provider.  Drink enough water and fluids to keep your urine clear or pale yellow.  Keep the area around your vagina and rectum clean. ? Wash the area daily with warm water. ? Wipe yourself from front to back after using the toilet.  Keep all follow-up visits as told by your health care provider. This is important. How is this prevented?  Do not douche.  Wash the outside of your vagina with warm water only.  Use protection when having sex. This includes latex condoms and dental dams.  Limit how many sexual partners you have. To help prevent bacterial vaginosis, it is best to have sex with just one partner (monogamous).  Make sure you and your sexual partner are tested for STIs.  Wear cotton or cotton-lined underwear.  Avoid wearing tight pants and pantyhose, especially during summer.  Limit the amount of alcohol that you drink.  Do not use any products that contain nicotine or tobacco, such as cigarettes and e-cigarettes. If you need help quitting, ask your health care provider.  Do not use illegal drugs. Where to find more information:  Centers for Disease Control and Prevention: SolutionApps.co.za  American Sexual Health Association (ASHA): www.ashastd.org  [place] Department of Health and Health and safety inspector, Office on Women's Health: ConventionalMedicines.si or http://www.anderson-williamson.info/ Contact a health care provider if:  Your symptoms do not improve, even after treatment.  You have more discharge or pain when urinating.  You have a fever.  You have pain in your abdomen.  You have pain during sex.  You have vaginal bleeding between periods. Summary  Bacterial vaginosis is a vaginal infection that occurs when the normal balance of bacteria in the  vagina is disrupted.  Because bacterial vaginosis increases your risk for STIs (sexually transmitted infections), getting treated can help reduce your risk for chlamydia, gonorrhea, herpes, and HIV (human immunodeficiency virus). Treatment is also important for preventing complications in pregnant women, because the condition can cause an early (premature) delivery.  This condition is treated with antibiotic medicines. These may be given as a pill, a vaginal cream, or a medicine that is put into the vagina (suppository). This information is not intended to replace advice given to you by your health care provider. Make sure you discuss any questions you have with your health care provider. Document Released: 04/11/2005 Document Revised: 12/26/2015 Document Reviewed: 12/26/2015 Elsevier Interactive Patient Education  2017 [employer].
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
## 2016-12-07 NOTE — MAU Provider Note (Signed)
History     CSN: [id number]  Arrival date and time: 12/07/16 1454   None     Chief Complaint  Patient presents with  . Abdominal Pain  . Possible Pregnancy   HPI 26 yo G3P2003 at 6 weeks by LMP here for the evaluation of cramping abdominal pain. Patient reports positive pregnancy test last week. She also had a fainting spell while walking last week. She reports experiencing these fainting spells frequently with her last pregnancy and thus didn't feel compelled to be medically evaluated. She reports lower abdominal cramping since last night without improvement. Patient denies any vaginal bleeding or discharge. She has not yet started prenatal care  OB History    Gravida Para Term Preterm AB Living   3 2 1 1   3    SAB TAB Ectopic Multiple Live Births           2      Past Medical History:  Diagnosis Date  . Anxiety   . Depression   . Irregular heart rate     Past Surgical History:  Procedure Laterality Date  . CESAREAN SECTION      History reviewed. No pertinent family history.  Social History  Substance Use Topics  . Smoking status: Current Every Day Smoker    Packs/day: 0.50    Types: Cigarettes  . Smokeless tobacco: Never Used  . Alcohol use No    Allergies: No Known Allergies  Prescriptions Prior to Admission  Medication Sig Dispense Refill Last Dose  . HYDROcodone-acetaminophen (NORCO/VICODIN) 5-325 MG per tablet Take 1-2 tablets by mouth every 6 (six) hours as needed for moderate pain or severe pain. 10 tablet 0   . ibuprofen (ADVIL,MOTRIN) 200 MG tablet Take 600 mg by mouth every 6 (six) hours as needed for mild pain.    03/08/2014 at Unknown time    Review of Systems  See pertinent in HPI Physical Exam   Blood pressure 130/62, pulse (!) 51, temperature 98.6 F (37 C), temperature source Oral, resp. rate 16, weight 252 lb (114.3 kg), last menstrual period 09/26/2016, SpO2 100 %.  Physical Exam GENERAL: Well-developed, well-nourished female in no  acute distress.  LUNGS: Clear to auscultation bilaterally.  HEART: Regular rate and rhythm. ABDOMEN: Soft, nontender, nondistended. Obese PELVIC: Normal external female genitalia. Vagina is pink and rugated.  Normal discharge. Normal appearing cervix. Uterus is normal in size. No adnexal mass or tenderness. EXTREMITIES: No cyanosis, clubbing, or edema, 2+ distal pulses.  MAU Course  Procedures  MDM Results for orders placed or performed during the hospital encounter of 12/07/16 (from the past 24 hour(s))  Urinalysis, Routine w reflex microscopic     Status: Abnormal   Collection Time: 12/07/16  3:25 PM  Result Value Ref Range   Color, Urine STRAW (A) YELLOW   APPearance CLEAR CLEAR   Specific Gravity, Urine 1.012 1.005 - 1.030   pH 6.0 5.0 - 8.0   Glucose, UA NEGATIVE NEGATIVE mg/dL   Hgb urine dipstick NEGATIVE NEGATIVE   Bilirubin Urine NEGATIVE NEGATIVE   Ketones, ur NEGATIVE NEGATIVE mg/dL   Protein, ur NEGATIVE NEGATIVE mg/dL   Nitrite NEGATIVE NEGATIVE   Leukocytes, UA NEGATIVE NEGATIVE  Pregnancy, urine POC     Status: Abnormal   Collection Time: 12/07/16  3:29 PM  Result Value Ref Range   Preg Test, Ur POSITIVE (A) NEGATIVE  Wet prep, genital     Status: Abnormal   Collection Time: 12/07/16  3:37 PM  Result Value Ref  Range   Yeast Wet Prep HPF POC NONE SEEN NONE SEEN   Trich, Wet Prep PRESENT (A) NONE SEEN   Clue Cells Wet Prep HPF POC PRESENT (A) NONE SEEN   WBC, Wet Prep HPF POC MANY (A) NONE SEEN   Sperm NONE SEEN    US Ob Comp Less 14 Wks  Result Date: 12/07/2016 CLINICAL DATA:  26 year old pregnant female presents with abdominal soreness and cramping. Recent fall 2 days prior. EDC by LMP: 07/03/2017, projecting to an expected gestational age of [redacted] weeks 2 days. EXAM: OBSTETRIC <14 WK ULTRASOUND TECHNIQUE: Transabdominal ultrasound was performed for evaluation of the gestation as well as the maternal uterus and adnexal regions. COMPARISON:  No prior scans from  this gestation. FINDINGS: Intrauterine gestational sac: Single intrauterine gestational sac is normal in size, shape and position. Yolk sac:  Visualized. Fetus: Visualized. Fetal anatomy not evaluated at this early gestational age. Fetal Cardiac Activity: Regular rate and rhythm. Fetal Heart Rate: 162 bpm CRL:   20.7  mm   8 w 4 d                  [place] EDC: 07/15/2017 Subchorionic hemorrhage: Questionable small perigestational bleed in the left anterior cavity. Maternal uterus/adnexae: Hypoechoic 2.2 x 1.5 x 2.0 cm myometrial focus in the anterior uterine body could represent a small intramural fibroid versus focal myometrial contraction. Right ovary contains a corpus luteum. No suspicious ovarian or adnexal masses. No free fluid in the pelvis. IMPRESSION: 1. Single living intrauterine gestation at 8 weeks 4 days by crown-rump length, mildly discordant with the expected gestational age of [redacted] weeks 2 days by provided menstrual dating. Questionable small perigestational bleed. Normal fetal cardiac activity. A follow-up obstetric scan may be obtained in 4 weeks as clinically warranted to assess for appropriate fetal growth given these findings. 2. Solitary small anterior uterine body fibroid versus focal myometrial contraction. 3. No ovarian or adnexal masses.  No free fluid in the pelvis. Electronically Signed   By: Delbert Phenix M.D.   On: 12/07/2016 16:31    Assessment and Plan  26 yo G3P2003 with an IUP at [redacted]w[redacted]d with BV and trich - Patient informed of wet prep results. Rx Flagyl provided - Her partner needs to be informed of trich infection and also needs treatment - Rx prenatal vitamins provided - Patient advised to start prenatal care - Precautions reviewed  Andric Kerce 12/07/2016, 4:37 PM

## 2016-12-08 LAB — GC/CHLAMYDIA PROBE AMP (~~LOC~~) NOT AT ARMC
Chlamydia: NEGATIVE
NEISSERIA GONORRHEA: NEGATIVE

## 2017-01-03 ENCOUNTER — Encounter: Payer: Medicaid Other | Admitting: Obstetrics and Gynecology

## 2017-01-03 ENCOUNTER — Encounter: Payer: Self-pay | Admitting: General Practice

## 2017-09-25 ENCOUNTER — Encounter (HOSPITAL_COMMUNITY): Payer: Self-pay

## 2018-08-01 ENCOUNTER — Encounter: Payer: Self-pay | Admitting: *Deleted

## 2021-04-15 ENCOUNTER — Encounter: Payer: Self-pay | Admitting: General Practice
